# Patient Record
Sex: Female | Born: 1955
Health system: Southern US, Community
[De-identification: ages and names within clinical notes are randomized; demographics above are authoritative.]

---

## 2001-01-30 ENCOUNTER — Encounter: Payer: Self-pay | Admitting: Neurosurgery

## 2001-02-03 ENCOUNTER — Ambulatory Visit (HOSPITAL_COMMUNITY): Admission: RE | Admit: 2001-02-03 | Discharge: 2001-02-04 | Payer: Self-pay | Admitting: Neurosurgery

## 2001-02-03 ENCOUNTER — Encounter: Payer: Self-pay | Admitting: Neurosurgery

## 2001-03-25 ENCOUNTER — Encounter: Payer: Self-pay | Admitting: Neurosurgery

## 2001-03-25 ENCOUNTER — Ambulatory Visit (HOSPITAL_COMMUNITY): Admission: RE | Admit: 2001-03-25 | Discharge: 2001-03-25 | Payer: Self-pay | Admitting: Ophthalmology

## 2001-08-12 ENCOUNTER — Other Ambulatory Visit: Admission: RE | Admit: 2001-08-12 | Discharge: 2001-08-12 | Payer: Self-pay | Admitting: Obstetrics & Gynecology

## 2003-01-11 ENCOUNTER — Encounter: Admission: RE | Admit: 2003-01-11 | Discharge: 2003-01-11 | Payer: Self-pay | Admitting: *Deleted

## 2003-02-03 ENCOUNTER — Other Ambulatory Visit: Admission: RE | Admit: 2003-02-03 | Discharge: 2003-02-03 | Payer: Self-pay | Admitting: Obstetrics & Gynecology

## 2004-03-22 ENCOUNTER — Other Ambulatory Visit: Admission: RE | Admit: 2004-03-22 | Discharge: 2004-03-22 | Payer: Self-pay | Admitting: Obstetrics & Gynecology

## 2010-09-05 ENCOUNTER — Other Ambulatory Visit: Payer: Self-pay | Admitting: Obstetrics & Gynecology

## 2010-09-05 DIAGNOSIS — R928 Other abnormal and inconclusive findings on diagnostic imaging of breast: Secondary | ICD-10-CM

## 2010-09-11 ENCOUNTER — Ambulatory Visit
Admission: RE | Admit: 2010-09-11 | Discharge: 2010-09-11 | Disposition: A | Discharge status: Home / Self Care | Payer: BC Managed Care – PPO | Source: Ambulatory Visit | Attending: Obstetrics & Gynecology | Admitting: Obstetrics & Gynecology

## 2010-09-11 DIAGNOSIS — R928 Other abnormal and inconclusive findings on diagnostic imaging of breast: Secondary | ICD-10-CM

## 2011-04-11 ENCOUNTER — Other Ambulatory Visit: Payer: Self-pay | Admitting: Obstetrics & Gynecology

## 2011-04-11 DIAGNOSIS — R234 Changes in skin texture: Secondary | ICD-10-CM

## 2011-04-19 ENCOUNTER — Ambulatory Visit
Admission: RE | Admit: 2011-04-19 | Discharge: 2011-04-19 | Disposition: A | Discharge status: Home / Self Care | Payer: BC Managed Care – PPO | Source: Ambulatory Visit | Attending: Obstetrics & Gynecology | Admitting: Obstetrics & Gynecology

## 2011-04-19 DIAGNOSIS — R234 Changes in skin texture: Secondary | ICD-10-CM

## 2011-09-06 ENCOUNTER — Other Ambulatory Visit: Payer: Self-pay | Admitting: Obstetrics & Gynecology

## 2011-09-06 DIAGNOSIS — Z853 Personal history of malignant neoplasm of breast: Secondary | ICD-10-CM

## 2011-09-13 ENCOUNTER — Ambulatory Visit
Admission: RE | Admit: 2011-09-13 | Discharge: 2011-09-13 | Disposition: A | Discharge status: Home / Self Care | Payer: BC Managed Care – PPO | Source: Ambulatory Visit | Attending: Obstetrics & Gynecology | Admitting: Obstetrics & Gynecology

## 2011-09-13 DIAGNOSIS — Z853 Personal history of malignant neoplasm of breast: Secondary | ICD-10-CM

## 2011-09-13 DIAGNOSIS — N6459 Other signs and symptoms in breast: Secondary | ICD-10-CM

## 2012-09-11 ENCOUNTER — Other Ambulatory Visit: Payer: Self-pay | Admitting: Obstetrics & Gynecology

## 2012-09-11 DIAGNOSIS — R928 Other abnormal and inconclusive findings on diagnostic imaging of breast: Secondary | ICD-10-CM

## 2012-09-15 ENCOUNTER — Ambulatory Visit
Admission: RE | Admit: 2012-09-15 | Discharge: 2012-09-15 | Disposition: A | Discharge status: Home / Self Care | Payer: BC Managed Care – PPO | Source: Ambulatory Visit | Attending: Obstetrics & Gynecology | Admitting: Obstetrics & Gynecology

## 2012-09-15 DIAGNOSIS — R928 Other abnormal and inconclusive findings on diagnostic imaging of breast: Secondary | ICD-10-CM

## 2012-09-29 ENCOUNTER — Other Ambulatory Visit: Payer: BC Managed Care – PPO

## 2014-11-13 IMAGING — MG MM DIAGNOSTIC UNILATERAL L
2 series · 2 of 2 positions shown · non-contrast
Comparison: Previous examinations, including the screening
mammogram obtained at [[facility]OS[facility]ITAL[facility] on 09/08/2012.

CLINICAL DATA: Possible left breast asymmetry at recent screening
mammography.

DIGITAL DIAGNOSTIC LEFT MAMMOGRAM WITH CAD

[L CC]
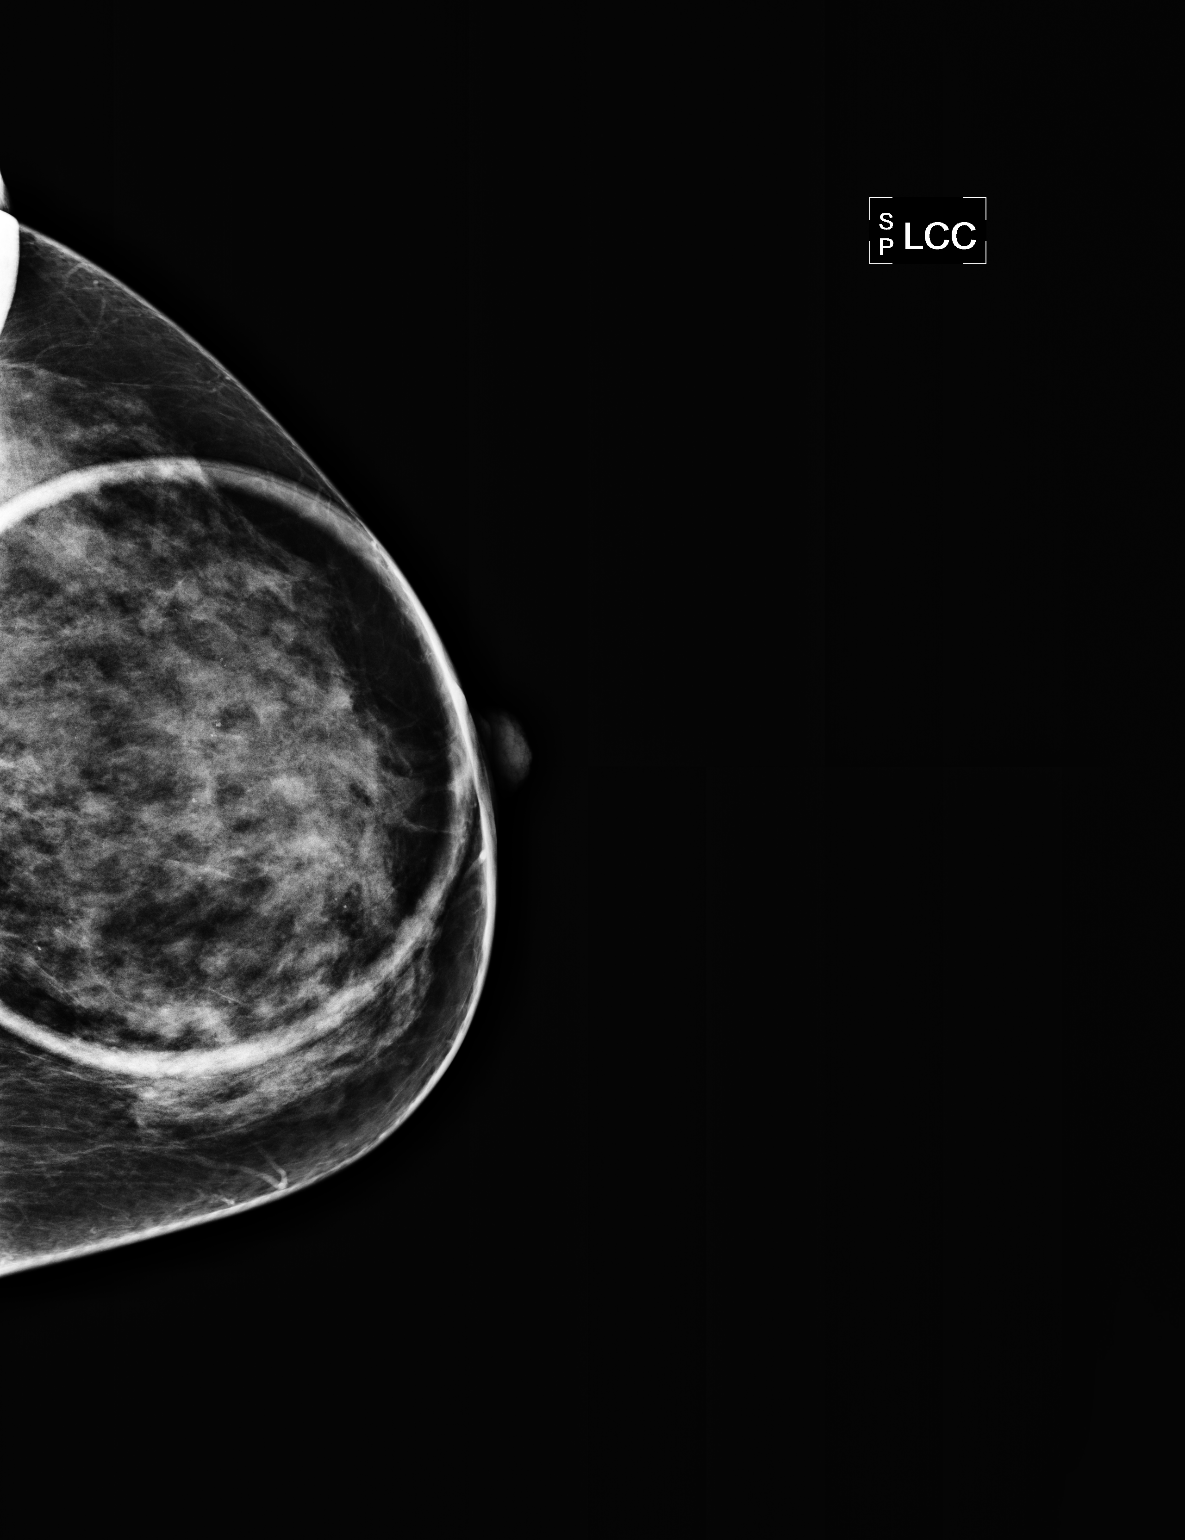

[L ML]
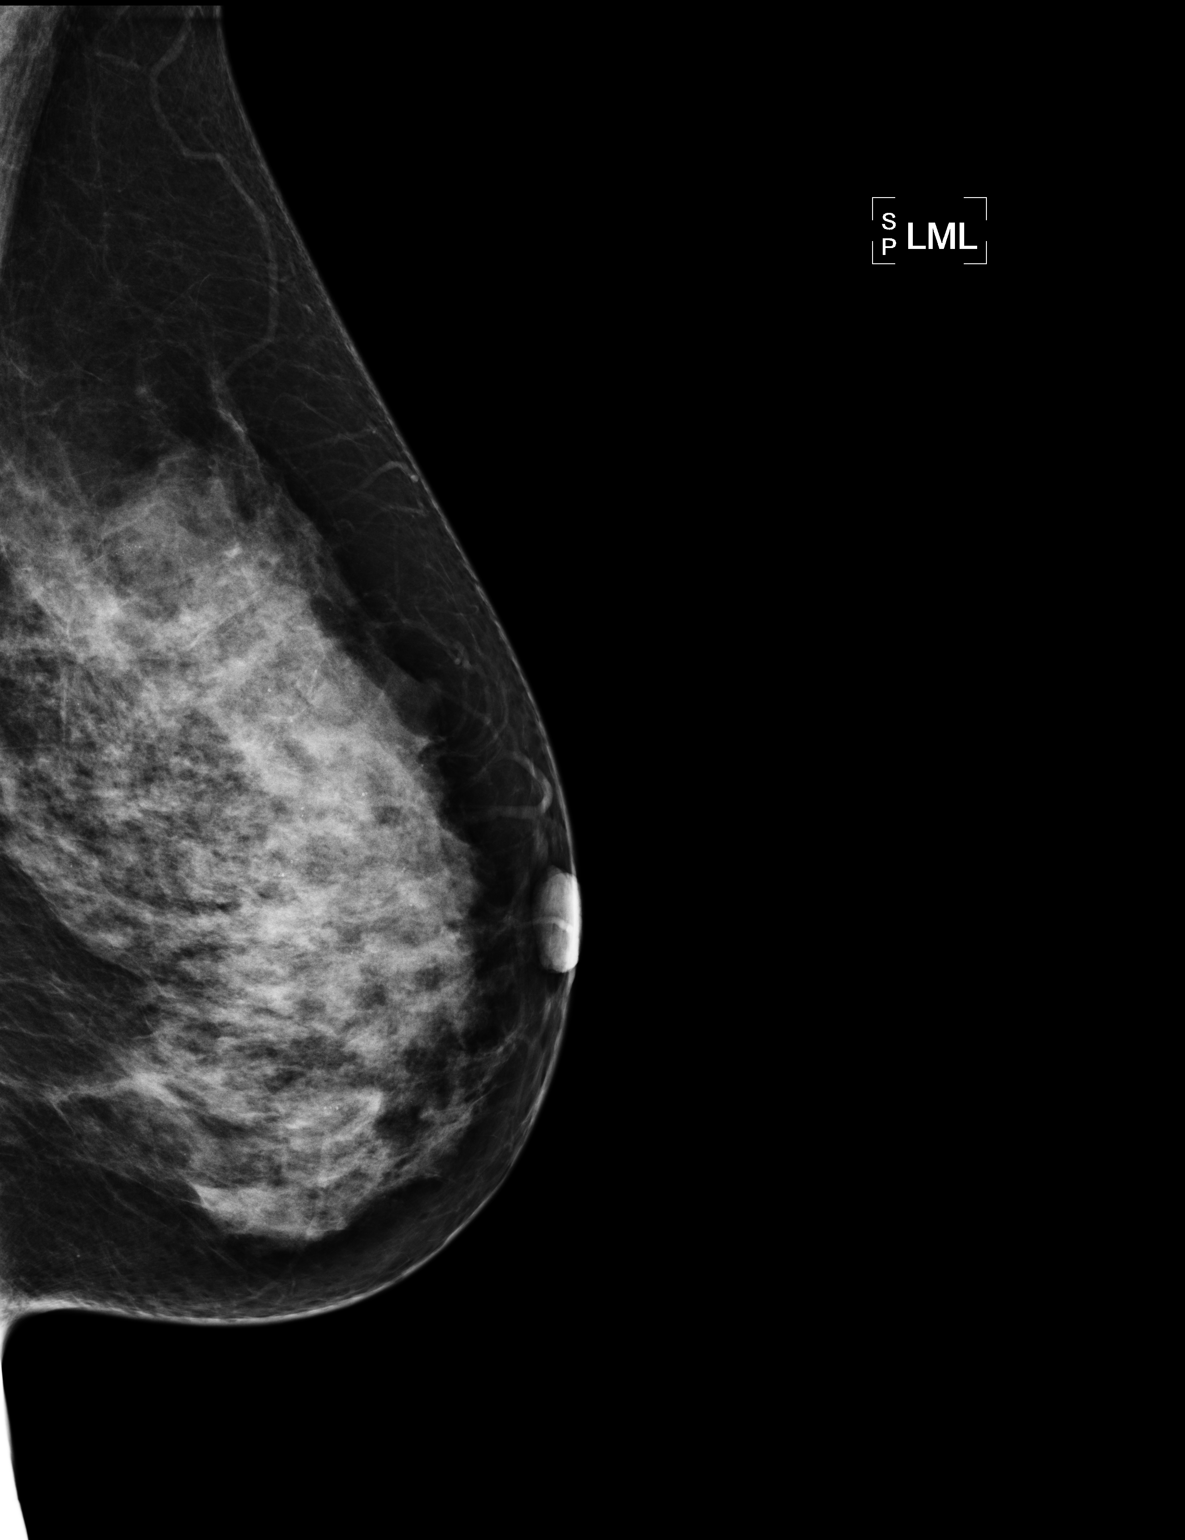

[2 of 2 positions shown; findings below may reference images not displayed]

FINDINGS: ACR Breast Density Category c:  The breast tissue is
heterogeneously dense, which may obscure small masses.

True lateral and spot compression craniocaudal views of the left
breast demonstrate normal appearing fibroglandular tissue at the
location of the recently suspected asymmetry, unchanged compared to
the previous examinations.

Mammographic images were processed with CAD.
IMPRESSION: No evidence of malignancy.  The recently suspected left breast
asymmetry represented overlapping of normal fibroglandular tissue.

RECOMMENDATION:
Bilateral screening mammogram in 1 year.

I have discussed the findings and recommendations with the patient.
Results were also provided in writing at the conclusion of the
visit.  If applicable, a reminder letter will be sent to the
patient regarding her next appointment.

BI-RADS CATEGORY 1:  Negative.

## 2016-08-29 ENCOUNTER — Other Ambulatory Visit: Payer: Self-pay | Admitting: Anesthesiology

## 2016-08-29 DIAGNOSIS — M4712 Other spondylosis with myelopathy, cervical region: Secondary | ICD-10-CM

## 2016-09-15 ENCOUNTER — Ambulatory Visit
Admission: RE | Admit: 2016-09-15 | Discharge: 2016-09-15 | Disposition: A | Discharge status: Home / Self Care | Payer: BLUE CROSS/BLUE SHIELD | Source: Ambulatory Visit | Attending: Anesthesiology | Admitting: Anesthesiology

## 2016-09-15 DIAGNOSIS — M4712 Other spondylosis with myelopathy, cervical region: Secondary | ICD-10-CM

## 2016-09-15 MED ORDER — GADOBENATE DIMEGLUMINE 529 MG/ML IV SOLN
10.0000 mL | Freq: Once | INTRAVENOUS | Status: AC | PRN
  Administered 2016-09-15: 10 mL via INTRAVENOUS

## 2016-09-16 ENCOUNTER — Other Ambulatory Visit: Payer: Self-pay

## 2017-09-05 MED FILL — SHINGRIX 50 MCG SUS: 50 | 1 days supply | Qty: 1 | Fill #0

## 2017-11-11 MED FILL — SHINGRIX 50 MCG SUS: 50 | 1 days supply | Qty: 1 | Fill #1

## 2020-07-20 ENCOUNTER — Other Ambulatory Visit: Payer: Self-pay | Admitting: Family Medicine

## 2020-07-20 DIAGNOSIS — R5381 Other malaise: Secondary | ICD-10-CM

## 2020-07-25 ENCOUNTER — Other Ambulatory Visit: Payer: Self-pay | Admitting: Family Medicine

## 2020-07-25 DIAGNOSIS — E2839 Other primary ovarian failure: Secondary | ICD-10-CM

## 2020-08-18 ENCOUNTER — Other Ambulatory Visit: Payer: Self-pay

## 2020-08-18 ENCOUNTER — Ambulatory Visit
Admission: RE | Admit: 2020-08-18 | Discharge: 2020-08-18 | Disposition: A | Discharge status: Home / Self Care | Payer: 59 | Source: Ambulatory Visit | Attending: Family Medicine | Admitting: Family Medicine

## 2020-08-18 DIAGNOSIS — E2839 Other primary ovarian failure: Secondary | ICD-10-CM

## 2020-12-25 LAB — COLOGUARD: COLOGUARD: NEGATIVE

## 2022-01-07 ENCOUNTER — Other Ambulatory Visit: Payer: Self-pay

## 2022-01-07 ENCOUNTER — Emergency Department (HOSPITAL_BASED_OUTPATIENT_CLINIC_OR_DEPARTMENT_OTHER)
Admission: EM | Admit: 2022-01-07 | Discharge: 2022-01-07 | Disposition: A | Discharge status: Home / Self Care | Payer: 59 | Attending: Emergency Medicine | Admitting: Emergency Medicine

## 2022-01-07 ENCOUNTER — Encounter (HOSPITAL_BASED_OUTPATIENT_CLINIC_OR_DEPARTMENT_OTHER): Payer: Self-pay

## 2022-01-07 DIAGNOSIS — I10 Essential (primary) hypertension: Secondary | ICD-10-CM

## 2022-01-07 DIAGNOSIS — F419 Anxiety disorder, unspecified: Secondary | ICD-10-CM | POA: Diagnosis not present

## 2022-01-07 LAB — BASIC METABOLIC PANEL
Anion gap: 10 (ref 5–15)
BUN: 15 mg/dL (ref 8–23)
CO2: 25 mmol/L (ref 22–32)
Calcium: 10.8 mg/dL — ABNORMAL HIGH (ref 8.9–10.3)
Chloride: 104 mmol/L (ref 98–111)
Creatinine, Ser: 1.01 mg/dL — ABNORMAL HIGH (ref 0.44–1.00)
GFR, Estimated: >60 mL/min (ref >60)
Glucose, Bld: 120 mg/dL — ABNORMAL HIGH (ref 70–99)
Potassium: 3.7 mmol/L (ref 3.5–5.1)
Sodium: 139 mmol/L (ref 135–145)

## 2022-01-07 LAB — CBC
HCT: 36.4 % (ref 36.0–46.0)
Hemoglobin: 11.9 g/dL — ABNORMAL LOW (ref 12.0–15.0)
MCH: 29.6 pg (ref 26.0–34.0)
MCHC: 32.7 g/dL (ref 30.0–36.0)
MCV: 90.5 fL (ref 80.0–100.0)
Platelets: 228 10*3/uL (ref 150–400)
RBC: 4.02 MIL/uL (ref 3.87–5.11)
RDW: 13.2 % (ref 11.5–15.5)
WBC: 7.4 10*3/uL (ref 4.0–10.5)
nRBC: 0 % (ref 0.0–0.2)

## 2022-01-07 NOTE — ED Provider Notes (Signed)
[facility] EMERGENCY DEPT Provider Note   CSN: [id number] Arrival date & time: 01/07/22  1236     History  Chief Complaint  Patient presents with   Hypertension    Tara Wagner is a 66 y.o. female.  Patient is a 66 year old female with a past medical history of recently diagnosed hypertension and spinal cord injury presenting to the emergency department with high blood pressure.  Patient states that she went on a cruise at the beginning of the month and was having episodes of diarrhea.  She states that she was diagnosed with a UTI at that time.  She states that she followed up with her primary doctor when she returned from the cruise and was confirmed that she had a UTI.  She states after completing her antibiotics her diarrhea has resolved.  She states at that appointment though her blood pressure was elevated.  States her primary doctor initially started her on clonidine and she was seen again in the office last week and was switched to lisinopril.  She states that today is day 6 of the lisinopril and she started to feel jittery and lightheaded that prompted her to come to the emergency department.  She states that she has also been checking her blood pressure at home and it has remained elevated.  She denies any chest pain or shortness of breath, headaches, numbness or weakness.  The history is provided by the patient.  Hypertension       Home Medications Prior to Admission medications   Not on File      Allergies    Codeine and Sulfa antibiotics    Review of Systems   Review of Systems  Physical Exam Updated Vital Signs BP (!) 185/93   Pulse 69   Temp 97.8 F (36.6 C) (Oral)   Resp 18   Ht 4\' 11"  (1.499 m)   Wt 52.6 kg   SpO2 100%   BMI 23.43 kg/m  Physical Exam Vitals and nursing note reviewed.  Constitutional:      General: She is not in acute distress.    Appearance: Normal appearance.  HENT:     Head: Normocephalic and atraumatic.      Nose: Nose normal.     Mouth/Throat:     Mouth: Mucous membranes are moist.     Pharynx: Oropharynx is clear.  Eyes:     Extraocular Movements: Extraocular movements intact.     Conjunctiva/sclera: Conjunctivae normal.     Pupils: Pupils are equal, round, and reactive to light.  Cardiovascular:     Rate and Rhythm: Normal rate and regular rhythm.     Pulses: Normal pulses.     Heart sounds: Normal heart sounds.  Pulmonary:     Effort: Pulmonary effort is normal.     Breath sounds: Normal breath sounds.  Abdominal:     General: Abdomen is flat.     Palpations: Abdomen is soft.     Tenderness: There is no abdominal tenderness.  Musculoskeletal:        General: Normal range of motion.     Cervical back: Normal range of motion and neck supple.     Right lower leg: No edema.     Left lower leg: No edema.  Skin:    General: Skin is warm and dry.  Neurological:     General: No focal deficit present.     Mental Status: She is alert and oriented to person, place, and time.  Cranial Nerves: No cranial nerve deficit.     Sensory: No sensory deficit.     Motor: No weakness.     Coordination: Coordination normal.  Psychiatric:        Thought Content: Thought content normal.        Judgment: Judgment normal.     Comments: Mildly anxious appearing     ED Results / Procedures / Treatments   Labs (all labs ordered are listed, but only abnormal results are displayed) Labs Reviewed  BASIC METABOLIC PANEL - Abnormal; Notable for the following components:      Result Value   Glucose, Bld 120 (*)    Creatinine, Ser 1.01 (*)    Calcium 10.8 (*)    All other components within normal limits  CBC - Abnormal; Notable for the following components:   Hemoglobin 11.9 (*)    All other components within normal limits    EKG EKG Interpretation  Date/Time:  Sunday January 07 2022 13:02:58 EST Ventricular Rate:  74 PR Interval:  144 QRS Duration: 80 QT Interval:  358 QTC  Calculation: 397 R Axis:   1 Text Interpretation: Normal sinus rhythm Cannot rule out Anterior infarct , age undetermined Abnormal ECG When compared with ECG of 30-Jan-2001 14:58, No significant change was found Confirmed by Elayne Snare ([id number]) on 01/07/2022 2:09:17 PM  Radiology No results found.  Procedures Procedures    Medications Ordered in ED Medications - No data to display  ED Course/ Medical Decision Making/ A&P                           Medical Decision Making This patient presents to the ED with chief complaint(s) of hypertension with pertinent past medical history of recently diagnosed hypertension, previous spinal cord injury which further complicates the presenting complaint. The complaint involves an extensive differential diagnosis and also carries with it a high risk of complications and morbidity.    The differential diagnosis includes hypertension, hypertensive urgency or hypertensive emergency, though less likely as patient has no neurologic deficits and no chest pain, electrolyte abnormality or AKI from lisinopril  Additional history obtained: Additional history obtained from N/A Records reviewed Care Everywhere/External Records -blood pressure readings between 2015-2020 have been in the 110s to 130s  ED Course and Reassessment: Patient was initially evaluated by nursing in triage and had EKG and labs performed that shows no evidence of endorgan damage with a normal creatinine and no ischemic changes on EKG.  Patient's repeat blood pressure here is improving to 185/93.  Patient has no findings of stroke on exam.  Patient is stable for discharge home with primary care follow-up for further work-up and management of her high blood pressure.  Independent labs interpretation:  The following labs were independently interpreted: Within normal range  Independent visualization of imaging: N/A  Consultation: - Consulted or discussed management/test interpretation  w/ external professional: N/A  Consideration for admission or further workup: Patient has no emergent conditions requiring admission at this time and is stable for discharge home with primary care follow-up Social Determinants of health: N/A    Amount and/or Complexity of Data Reviewed Labs: ordered.          Final Clinical Impression(s) / ED Diagnoses Final diagnoses:  Hypertension, unspecified type    Rx / DC Orders ED Discharge Orders     None         Rexford Maus, DO 01/07/22 1431

## 2022-01-07 NOTE — ED Triage Notes (Signed)
Patient here POV from Home.  Endorses being on a Cruise 2 Weeks ago and having Diarrhea. All of those Symptoms have subsided but Patient states her BP has been high since.  Patient began taking Clonidine but was switched to Lisinopril.   BP Systolic Readings have been as high as 150-200. Associated with Fatigue. No Pain.   NAD Noted during Triage. A&Ox4. GCS 15. Ambulatory.

## 2022-01-07 NOTE — Discharge Instructions (Signed)
You were seen in the emergency department for your high blood pressure.  At time of discharge her blood pressure was 185/93.  You had no signs of damage to your heart or kidneys and no signs of stroke from your blood pressure being high.  You should continue to take your lisinopril as prescribed and you should follow-up with your primary doctor in the next few days to have your blood pressure rechecked and to determine if you need any changes to medications or further work-up.  You should return to the emergency department if you develop chest pain, you have numbness or weakness on one side of the body compared to the other, you pass out or if you have any other new or concerning symptoms.

## 2022-02-08 ENCOUNTER — Other Ambulatory Visit: Payer: Self-pay | Admitting: Obstetrics & Gynecology

## 2022-02-08 DIAGNOSIS — R928 Other abnormal and inconclusive findings on diagnostic imaging of breast: Secondary | ICD-10-CM

## 2022-02-21 ENCOUNTER — Ambulatory Visit
Admission: RE | Admit: 2022-02-21 | Discharge: 2022-02-21 | Disposition: A | Discharge status: Home / Self Care | Payer: 59 | Source: Ambulatory Visit | Attending: Obstetrics & Gynecology | Admitting: Obstetrics & Gynecology

## 2022-02-21 ENCOUNTER — Ambulatory Visit: Payer: 59

## 2022-02-21 DIAGNOSIS — R928 Other abnormal and inconclusive findings on diagnostic imaging of breast: Secondary | ICD-10-CM

## 2023-01-08 DIAGNOSIS — M961 Postlaminectomy syndrome, not elsewhere classified: Secondary | ICD-10-CM | POA: Diagnosis not present

## 2023-01-08 DIAGNOSIS — G894 Chronic pain syndrome: Secondary | ICD-10-CM | POA: Diagnosis not present

## 2023-01-08 DIAGNOSIS — M150 Primary generalized (osteo)arthritis: Secondary | ICD-10-CM | POA: Diagnosis not present

## 2023-01-08 DIAGNOSIS — M62838 Other muscle spasm: Secondary | ICD-10-CM | POA: Diagnosis not present

## 2023-02-11 DIAGNOSIS — Z124 Encounter for screening for malignant neoplasm of cervix: Secondary | ICD-10-CM | POA: Diagnosis not present

## 2023-02-11 DIAGNOSIS — Z01419 Encounter for gynecological examination (general) (routine) without abnormal findings: Secondary | ICD-10-CM | POA: Diagnosis not present

## 2023-02-11 DIAGNOSIS — Z1231 Encounter for screening mammogram for malignant neoplasm of breast: Secondary | ICD-10-CM | POA: Diagnosis not present

## 2023-02-21 DIAGNOSIS — K080 Exfoliation of teeth due to systemic causes: Secondary | ICD-10-CM | POA: Diagnosis not present

## 2023-02-22 DIAGNOSIS — R7303 Prediabetes: Secondary | ICD-10-CM | POA: Diagnosis not present

## 2023-02-22 DIAGNOSIS — I10 Essential (primary) hypertension: Secondary | ICD-10-CM | POA: Diagnosis not present

## 2023-02-22 DIAGNOSIS — E782 Mixed hyperlipidemia: Secondary | ICD-10-CM | POA: Diagnosis not present

## 2023-02-22 DIAGNOSIS — R944 Abnormal results of kidney function studies: Secondary | ICD-10-CM | POA: Diagnosis not present

## 2023-03-18 DIAGNOSIS — H524 Presbyopia: Secondary | ICD-10-CM | POA: Diagnosis not present

## 2023-03-18 DIAGNOSIS — Q149 Congenital malformation of posterior segment of eye, unspecified: Secondary | ICD-10-CM | POA: Diagnosis not present

## 2023-03-20 DIAGNOSIS — K080 Exfoliation of teeth due to systemic causes: Secondary | ICD-10-CM | POA: Diagnosis not present

## 2023-04-15 DIAGNOSIS — Z0000 Encounter for general adult medical examination without abnormal findings: Secondary | ICD-10-CM | POA: Diagnosis not present

## 2023-04-15 DIAGNOSIS — M810 Age-related osteoporosis without current pathological fracture: Secondary | ICD-10-CM | POA: Diagnosis not present

## 2023-04-15 DIAGNOSIS — I10 Essential (primary) hypertension: Secondary | ICD-10-CM | POA: Diagnosis not present

## 2023-04-15 DIAGNOSIS — N1831 Chronic kidney disease, stage 3a: Secondary | ICD-10-CM | POA: Diagnosis not present

## 2023-04-16 ENCOUNTER — Other Ambulatory Visit: Payer: Self-pay | Admitting: Family Medicine

## 2023-04-16 DIAGNOSIS — M810 Age-related osteoporosis without current pathological fracture: Secondary | ICD-10-CM

## 2023-07-08 DIAGNOSIS — M62838 Other muscle spasm: Secondary | ICD-10-CM | POA: Diagnosis not present

## 2023-07-08 DIAGNOSIS — M961 Postlaminectomy syndrome, not elsewhere classified: Secondary | ICD-10-CM | POA: Diagnosis not present

## 2023-07-08 DIAGNOSIS — M150 Primary generalized (osteo)arthritis: Secondary | ICD-10-CM | POA: Diagnosis not present

## 2023-07-08 DIAGNOSIS — G894 Chronic pain syndrome: Secondary | ICD-10-CM | POA: Diagnosis not present

## 2023-07-31 DIAGNOSIS — N1831 Chronic kidney disease, stage 3a: Secondary | ICD-10-CM | POA: Diagnosis not present

## 2023-08-06 DIAGNOSIS — R7303 Prediabetes: Secondary | ICD-10-CM | POA: Diagnosis not present

## 2023-08-06 DIAGNOSIS — N1831 Chronic kidney disease, stage 3a: Secondary | ICD-10-CM | POA: Diagnosis not present

## 2023-08-06 DIAGNOSIS — R809 Proteinuria, unspecified: Secondary | ICD-10-CM | POA: Diagnosis not present

## 2023-08-06 DIAGNOSIS — Z0000 Encounter for general adult medical examination without abnormal findings: Secondary | ICD-10-CM | POA: Diagnosis not present

## 2023-08-06 DIAGNOSIS — Z6823 Body mass index (BMI) 23.0-23.9, adult: Secondary | ICD-10-CM | POA: Diagnosis not present

## 2023-08-06 DIAGNOSIS — I10 Essential (primary) hypertension: Secondary | ICD-10-CM | POA: Diagnosis not present

## 2023-09-10 DIAGNOSIS — R7303 Prediabetes: Secondary | ICD-10-CM | POA: Diagnosis not present

## 2023-09-10 DIAGNOSIS — N1831 Chronic kidney disease, stage 3a: Secondary | ICD-10-CM | POA: Diagnosis not present

## 2023-09-10 DIAGNOSIS — I10 Essential (primary) hypertension: Secondary | ICD-10-CM | POA: Diagnosis not present

## 2023-09-10 DIAGNOSIS — R809 Proteinuria, unspecified: Secondary | ICD-10-CM | POA: Diagnosis not present

## 2023-09-20 DIAGNOSIS — K080 Exfoliation of teeth due to systemic causes: Secondary | ICD-10-CM | POA: Diagnosis not present

## 2023-09-23 DIAGNOSIS — K080 Exfoliation of teeth due to systemic causes: Secondary | ICD-10-CM | POA: Diagnosis not present

## 2023-12-11 DIAGNOSIS — S76312A Strain of muscle, fascia and tendon of the posterior muscle group at thigh level, left thigh, initial encounter: Secondary | ICD-10-CM | POA: Diagnosis not present

## 2023-12-11 DIAGNOSIS — M961 Postlaminectomy syndrome, not elsewhere classified: Secondary | ICD-10-CM | POA: Diagnosis not present

## 2023-12-11 DIAGNOSIS — G894 Chronic pain syndrome: Secondary | ICD-10-CM | POA: Diagnosis not present

## 2023-12-11 DIAGNOSIS — S76311A Strain of muscle, fascia and tendon of the posterior muscle group at thigh level, right thigh, initial encounter: Secondary | ICD-10-CM | POA: Diagnosis not present

## 2023-12-24 ENCOUNTER — Ambulatory Visit (HOSPITAL_BASED_OUTPATIENT_CLINIC_OR_DEPARTMENT_OTHER)
Admission: RE | Admit: 2023-12-24 | Discharge: 2023-12-24 | Disposition: A | Discharge status: Home / Self Care | Source: Ambulatory Visit | Attending: Family Medicine | Admitting: Family Medicine

## 2023-12-24 ENCOUNTER — Other Ambulatory Visit

## 2023-12-24 DIAGNOSIS — Z1212 Encounter for screening for malignant neoplasm of rectum: Secondary | ICD-10-CM | POA: Diagnosis not present

## 2023-12-24 DIAGNOSIS — M810 Age-related osteoporosis without current pathological fracture: Secondary | ICD-10-CM | POA: Insufficient documentation

## 2023-12-24 DIAGNOSIS — Z780 Asymptomatic menopausal state: Secondary | ICD-10-CM | POA: Diagnosis not present

## 2023-12-29 LAB — COLOGUARD: COLOGUARD: NEGATIVE

## 2024-01-13 DIAGNOSIS — D225 Melanocytic nevi of trunk: Secondary | ICD-10-CM | POA: Diagnosis not present

## 2024-01-13 DIAGNOSIS — Z1283 Encounter for screening for malignant neoplasm of skin: Secondary | ICD-10-CM | POA: Diagnosis not present

## 2024-01-13 DIAGNOSIS — L814 Other melanin hyperpigmentation: Secondary | ICD-10-CM | POA: Diagnosis not present

## 2024-01-13 DIAGNOSIS — D2362 Other benign neoplasm of skin of left upper limb, including shoulder: Secondary | ICD-10-CM | POA: Diagnosis not present

## 2024-01-13 DIAGNOSIS — L570 Actinic keratosis: Secondary | ICD-10-CM | POA: Diagnosis not present

## 2024-01-13 DIAGNOSIS — X32XXXA Exposure to sunlight, initial encounter: Secondary | ICD-10-CM | POA: Diagnosis not present

## 2024-01-13 DIAGNOSIS — L818 Other specified disorders of pigmentation: Secondary | ICD-10-CM | POA: Diagnosis not present

## 2024-01-13 DIAGNOSIS — L821 Other seborrheic keratosis: Secondary | ICD-10-CM | POA: Diagnosis not present
